# Patient Record
Sex: Female | Born: 1980
Health system: Southern US, Community
[De-identification: ages and names within clinical notes are randomized; demographics above are authoritative.]

## PROBLEM LIST (undated history)

## (undated) DIAGNOSIS — I509 Heart failure, unspecified: Secondary | ICD-10-CM

## (undated) HISTORY — PX: ABDOMINAL HYSTERECTOMY: SHX81

## (undated) HISTORY — PX: PACEMAKER PLACEMENT: SHX43

## (undated) HISTORY — PX: CARDIAC DEFIBRILLATOR PLACEMENT: SHX171

---

## 2008-05-29 ENCOUNTER — Ambulatory Visit: Payer: Self-pay | Admitting: Family Medicine

## 2011-03-06 ENCOUNTER — Ambulatory Visit: Payer: Self-pay

## 2013-07-28 DIAGNOSIS — I5022 Chronic systolic (congestive) heart failure: Secondary | ICD-10-CM | POA: Diagnosis not present

## 2013-09-16 DIAGNOSIS — I472 Ventricular tachycardia: Secondary | ICD-10-CM | POA: Diagnosis not present

## 2013-09-16 DIAGNOSIS — I4729 Other ventricular tachycardia: Secondary | ICD-10-CM | POA: Diagnosis not present

## 2013-09-16 DIAGNOSIS — Z5181 Encounter for therapeutic drug level monitoring: Secondary | ICD-10-CM | POA: Diagnosis not present

## 2013-09-16 DIAGNOSIS — I5022 Chronic systolic (congestive) heart failure: Secondary | ICD-10-CM | POA: Diagnosis not present

## 2013-09-16 DIAGNOSIS — Z79899 Other long term (current) drug therapy: Secondary | ICD-10-CM | POA: Diagnosis not present

## 2013-09-16 DIAGNOSIS — I428 Other cardiomyopathies: Secondary | ICD-10-CM | POA: Diagnosis not present

## 2013-09-16 DIAGNOSIS — Z95810 Presence of automatic (implantable) cardiac defibrillator: Secondary | ICD-10-CM | POA: Diagnosis not present

## 2013-09-16 DIAGNOSIS — R9431 Abnormal electrocardiogram [ECG] [EKG]: Secondary | ICD-10-CM | POA: Diagnosis not present

## 2013-09-16 DIAGNOSIS — I4949 Other premature depolarization: Secondary | ICD-10-CM | POA: Diagnosis not present

## 2013-09-16 DIAGNOSIS — Z4502 Encounter for adjustment and management of automatic implantable cardiac defibrillator: Secondary | ICD-10-CM | POA: Diagnosis not present

## 2013-12-31 DIAGNOSIS — M79671 Pain in right foot: Secondary | ICD-10-CM | POA: Diagnosis not present

## 2014-02-04 DIAGNOSIS — I5022 Chronic systolic (congestive) heart failure: Secondary | ICD-10-CM | POA: Diagnosis not present

## 2014-03-01 DIAGNOSIS — I472 Ventricular tachycardia: Secondary | ICD-10-CM | POA: Diagnosis not present

## 2014-04-20 DIAGNOSIS — I428 Other cardiomyopathies: Secondary | ICD-10-CM | POA: Diagnosis not present

## 2014-04-20 DIAGNOSIS — O903 Peripartum cardiomyopathy: Secondary | ICD-10-CM | POA: Diagnosis not present

## 2014-04-20 DIAGNOSIS — N951 Menopausal and female climacteric states: Secondary | ICD-10-CM | POA: Diagnosis not present

## 2014-04-20 DIAGNOSIS — R232 Flushing: Secondary | ICD-10-CM | POA: Diagnosis not present

## 2014-04-20 DIAGNOSIS — R5382 Chronic fatigue, unspecified: Secondary | ICD-10-CM | POA: Diagnosis not present

## 2014-04-20 DIAGNOSIS — R5383 Other fatigue: Secondary | ICD-10-CM | POA: Diagnosis not present

## 2014-04-20 DIAGNOSIS — Z01419 Encounter for gynecological examination (general) (routine) without abnormal findings: Secondary | ICD-10-CM | POA: Diagnosis not present

## 2014-08-31 DIAGNOSIS — I5022 Chronic systolic (congestive) heart failure: Secondary | ICD-10-CM | POA: Diagnosis not present

## 2014-12-19 DIAGNOSIS — F419 Anxiety disorder, unspecified: Secondary | ICD-10-CM | POA: Diagnosis not present

## 2015-01-05 DIAGNOSIS — F419 Anxiety disorder, unspecified: Secondary | ICD-10-CM | POA: Diagnosis not present

## 2015-01-15 ENCOUNTER — Ambulatory Visit
Admission: EM | Admit: 2015-01-15 | Discharge: 2015-01-15 | Disposition: A | Discharge status: Home / Self Care | Payer: Medicare Other | Attending: Family Medicine | Admitting: Family Medicine

## 2015-01-15 ENCOUNTER — Encounter: Payer: Self-pay | Admitting: *Deleted

## 2015-01-15 DIAGNOSIS — B349 Viral infection, unspecified: Secondary | ICD-10-CM | POA: Diagnosis not present

## 2015-01-15 DIAGNOSIS — R1110 Vomiting, unspecified: Secondary | ICD-10-CM | POA: Insufficient documentation

## 2015-01-15 DIAGNOSIS — R509 Fever, unspecified: Secondary | ICD-10-CM | POA: Diagnosis not present

## 2015-01-15 HISTORY — DX: Heart failure, unspecified: I50.9

## 2015-01-15 LAB — CBC WITH DIFFERENTIAL/PLATELET
Basophils Absolute: 0 10*3/uL (ref 0–0.1)
Basophils Relative: 0 %
Eosinophils Absolute: 0 10*3/uL (ref 0–0.7)
Eosinophils Relative: 1 %
HEMATOCRIT: 42.6 % (ref 35.0–47.0)
HEMOGLOBIN: 14.4 g/dL (ref 12.0–16.0)
LYMPHS PCT: 9 %
Lymphs Abs: 0.5 10*3/uL — ABNORMAL LOW (ref 1.0–3.6)
MCH: 30.8 pg (ref 26.0–34.0)
MCHC: 33.8 g/dL (ref 32.0–36.0)
MCV: 91.1 fL (ref 80.0–100.0)
MONO ABS: 0.5 10*3/uL (ref 0.2–0.9)
MONOS PCT: 8 %
NEUTROS ABS: 4.7 10*3/uL (ref 1.4–6.5)
NEUTROS PCT: 82 %
Platelets: 125 10*3/uL — ABNORMAL LOW (ref 150–440)
RBC: 4.68 MIL/uL (ref 3.80–5.20)
RDW: 13.3 % (ref 11.5–14.5)
WBC: 5.8 10*3/uL (ref 3.6–11.0)

## 2015-01-15 LAB — URINALYSIS COMPLETE WITH MICROSCOPIC (ARMC ONLY)
BACTERIA UA: NONE SEEN — AB
BILIRUBIN URINE: NEGATIVE
GLUCOSE, UA: NEGATIVE mg/dL
Hgb urine dipstick: NEGATIVE
Ketones, ur: NEGATIVE mg/dL
LEUKOCYTES UA: NEGATIVE
NITRITE: NEGATIVE
PH: 5.5 (ref 5.0–8.0)
Protein, ur: NEGATIVE mg/dL
RBC / HPF: NONE SEEN RBC/hpf (ref <3)
Specific Gravity, Urine: 1.01 (ref 1.005–1.030)

## 2015-01-15 LAB — RAPID INFLUENZA A&B ANTIGENS
Influenza A (ARMC): NOT DETECTED
Influenza B (ARMC): NOT DETECTED

## 2015-01-15 MED ORDER — IBUPROFEN 400 MG PO TABS
400.0000 mg | ORAL_TABLET | Freq: Once | ORAL | Status: AC
  Administered 2015-01-15: 400 mg via ORAL

## 2015-01-15 MED ORDER — ONDANSETRON 4 MG PO TBDP
4.0000 mg | ORAL_TABLET | Freq: Three times a day (TID) | ORAL | Status: DC | PRN
Start: 2015-01-15 — End: 2016-11-22

## 2015-01-15 NOTE — ED Provider Notes (Signed)
Patient presents today with symptoms of fever and vomiting. Patient also admits to having myalgias. She denies any joint pain or rash. She denies any upper respiratory symptoms. She has not had any diarrhea. She has a history of a hysterectomy. She does not have significant abdominal pain.  ROS: Negative except mentioned above. Vitals as per Epic.  GENERAL: NAD HEENT: no pharyngeal erythema, no exudate, no erythema of TMs, no cervical LAD RESP: CTA B CARD: RRR ABD: +BS, minimal suprapubic tenderness, no rebound or guarding, no flank tenderness  NEURO: CN II-XII grossly intact, negative meningeal signs  A/P: Viral Illness- Flu test negative, CBC shows white blood cell count of 5, urine analysis appears normal, would recommend supportive care at this time for patient will prescribe Zofran for nausea if needed, if symptoms do persist or worsen or change patient is to seek medical attention as discussed.  Paulina Fusi, MD 01/15/15 [phone]

## 2015-01-19 DIAGNOSIS — F419 Anxiety disorder, unspecified: Secondary | ICD-10-CM | POA: Diagnosis not present

## 2015-01-30 DIAGNOSIS — F419 Anxiety disorder, unspecified: Secondary | ICD-10-CM | POA: Diagnosis not present

## 2015-02-13 DIAGNOSIS — F419 Anxiety disorder, unspecified: Secondary | ICD-10-CM | POA: Diagnosis not present

## 2015-02-27 DIAGNOSIS — F419 Anxiety disorder, unspecified: Secondary | ICD-10-CM | POA: Diagnosis not present

## 2015-03-07 DIAGNOSIS — I472 Ventricular tachycardia: Secondary | ICD-10-CM | POA: Diagnosis not present

## 2015-03-07 DIAGNOSIS — Z4502 Encounter for adjustment and management of automatic implantable cardiac defibrillator: Secondary | ICD-10-CM | POA: Diagnosis not present

## 2015-03-07 DIAGNOSIS — Z95810 Presence of automatic (implantable) cardiac defibrillator: Secondary | ICD-10-CM | POA: Diagnosis not present

## 2015-03-13 DIAGNOSIS — F419 Anxiety disorder, unspecified: Secondary | ICD-10-CM | POA: Diagnosis not present

## 2015-03-15 DIAGNOSIS — Z23 Encounter for immunization: Secondary | ICD-10-CM | POA: Diagnosis not present

## 2015-03-15 DIAGNOSIS — Z79899 Other long term (current) drug therapy: Secondary | ICD-10-CM | POA: Diagnosis not present

## 2015-03-15 DIAGNOSIS — I5022 Chronic systolic (congestive) heart failure: Secondary | ICD-10-CM | POA: Diagnosis not present

## 2015-03-15 DIAGNOSIS — I472 Ventricular tachycardia: Secondary | ICD-10-CM | POA: Diagnosis not present

## 2015-04-17 DIAGNOSIS — F419 Anxiety disorder, unspecified: Secondary | ICD-10-CM | POA: Diagnosis not present

## 2015-05-01 DIAGNOSIS — F419 Anxiety disorder, unspecified: Secondary | ICD-10-CM | POA: Diagnosis not present

## 2015-05-15 DIAGNOSIS — F419 Anxiety disorder, unspecified: Secondary | ICD-10-CM | POA: Diagnosis not present

## 2015-06-08 DIAGNOSIS — Z95810 Presence of automatic (implantable) cardiac defibrillator: Secondary | ICD-10-CM | POA: Diagnosis not present

## 2015-06-08 DIAGNOSIS — O903 Peripartum cardiomyopathy: Secondary | ICD-10-CM | POA: Diagnosis not present

## 2015-06-26 DIAGNOSIS — F419 Anxiety disorder, unspecified: Secondary | ICD-10-CM | POA: Diagnosis not present

## 2015-07-10 DIAGNOSIS — F419 Anxiety disorder, unspecified: Secondary | ICD-10-CM | POA: Diagnosis not present

## 2015-07-24 DIAGNOSIS — F419 Anxiety disorder, unspecified: Secondary | ICD-10-CM | POA: Diagnosis not present

## 2015-08-23 DIAGNOSIS — F419 Anxiety disorder, unspecified: Secondary | ICD-10-CM | POA: Diagnosis not present

## 2015-09-04 DIAGNOSIS — F419 Anxiety disorder, unspecified: Secondary | ICD-10-CM | POA: Diagnosis not present

## 2015-09-20 DIAGNOSIS — I5022 Chronic systolic (congestive) heart failure: Secondary | ICD-10-CM | POA: Diagnosis not present

## 2015-09-20 DIAGNOSIS — Z8679 Personal history of other diseases of the circulatory system: Secondary | ICD-10-CM | POA: Diagnosis not present

## 2015-09-20 DIAGNOSIS — I493 Ventricular premature depolarization: Secondary | ICD-10-CM | POA: Diagnosis not present

## 2015-09-20 DIAGNOSIS — I4581 Long QT syndrome: Secondary | ICD-10-CM | POA: Diagnosis not present

## 2015-09-20 DIAGNOSIS — F419 Anxiety disorder, unspecified: Secondary | ICD-10-CM | POA: Diagnosis not present

## 2015-10-02 DIAGNOSIS — F419 Anxiety disorder, unspecified: Secondary | ICD-10-CM | POA: Diagnosis not present

## 2015-10-05 DIAGNOSIS — Z8679 Personal history of other diseases of the circulatory system: Secondary | ICD-10-CM | POA: Diagnosis not present

## 2015-10-05 DIAGNOSIS — I5022 Chronic systolic (congestive) heart failure: Secondary | ICD-10-CM | POA: Diagnosis not present

## 2015-10-05 DIAGNOSIS — Z4502 Encounter for adjustment and management of automatic implantable cardiac defibrillator: Secondary | ICD-10-CM | POA: Diagnosis not present

## 2015-10-05 DIAGNOSIS — Z95810 Presence of automatic (implantable) cardiac defibrillator: Secondary | ICD-10-CM | POA: Diagnosis not present

## 2015-10-05 DIAGNOSIS — R000 Tachycardia, unspecified: Secondary | ICD-10-CM | POA: Diagnosis not present

## 2015-10-10 DIAGNOSIS — I493 Ventricular premature depolarization: Secondary | ICD-10-CM | POA: Diagnosis not present

## 2015-10-16 DIAGNOSIS — F419 Anxiety disorder, unspecified: Secondary | ICD-10-CM | POA: Diagnosis not present

## 2015-10-23 DIAGNOSIS — R931 Abnormal findings on diagnostic imaging of heart and coronary circulation: Secondary | ICD-10-CM | POA: Diagnosis not present

## 2015-10-23 DIAGNOSIS — I5022 Chronic systolic (congestive) heart failure: Secondary | ICD-10-CM | POA: Diagnosis not present

## 2015-10-23 DIAGNOSIS — I517 Cardiomegaly: Secondary | ICD-10-CM | POA: Diagnosis not present

## 2015-10-30 DIAGNOSIS — F419 Anxiety disorder, unspecified: Secondary | ICD-10-CM | POA: Diagnosis not present

## 2015-11-14 DIAGNOSIS — F419 Anxiety disorder, unspecified: Secondary | ICD-10-CM | POA: Diagnosis not present

## 2015-11-20 DIAGNOSIS — F419 Anxiety disorder, unspecified: Secondary | ICD-10-CM | POA: Diagnosis not present

## 2016-01-18 DIAGNOSIS — Z23 Encounter for immunization: Secondary | ICD-10-CM | POA: Diagnosis not present

## 2016-02-16 ENCOUNTER — Encounter: Payer: Self-pay | Admitting: Emergency Medicine

## 2016-02-16 ENCOUNTER — Ambulatory Visit
Admission: EM | Admit: 2016-02-16 | Discharge: 2016-02-16 | Disposition: A | Discharge status: Home / Self Care | Payer: Medicare Other | Attending: Family Medicine | Admitting: Family Medicine

## 2016-02-16 DIAGNOSIS — J069 Acute upper respiratory infection, unspecified: Secondary | ICD-10-CM | POA: Diagnosis not present

## 2016-02-16 DIAGNOSIS — Z0001 Encounter for general adult medical examination with abnormal findings: Secondary | ICD-10-CM | POA: Diagnosis not present

## 2016-02-16 DIAGNOSIS — R05 Cough: Secondary | ICD-10-CM | POA: Diagnosis not present

## 2016-02-16 DIAGNOSIS — H9209 Otalgia, unspecified ear: Secondary | ICD-10-CM | POA: Diagnosis not present

## 2016-02-16 DIAGNOSIS — H6981 Other specified disorders of Eustachian tube, right ear: Secondary | ICD-10-CM

## 2016-02-16 DIAGNOSIS — J029 Acute pharyngitis, unspecified: Secondary | ICD-10-CM | POA: Insufficient documentation

## 2016-02-16 LAB — RAPID STREP SCREEN (MED CTR MEBANE ONLY): Streptococcus, Group A Screen (Direct): NEGATIVE

## 2016-02-16 MED ORDER — AMOXICILLIN 500 MG PO CAPS: 500.0000 mg | ORAL_CAPSULE | Freq: Three times a day (TID) | ORAL | 0 refills | Status: DC

## 2016-02-16 NOTE — ED Provider Notes (Signed)
[facility]    CSN: [id number] Arrival date & time: 02/16/16  1126     History   Chief Complaint Chief Complaint  Patient presents with  . Sore Throat  . Otalgia  . Cough    HPI Kim Morrow is a 35 y.o. female.   The history is provided by the patient.  URI  Presenting symptoms: congestion, ear pain, facial pain, rhinorrhea and sore throat   Presenting symptoms: no cough, no fatigue and no fever   Congestion:    Location:  Nasal   Interferes with sleep: yes   Ear pain:    Location:  Right   Severity:  Moderate   Onset quality:  Gradual   Timing:  Intermittent   Progression:  Waxing and waning   Chronicity:  New Rhinorrhea:    Quality:  Yellow   Severity:  Mild   Timing:  Intermittent Severity:  Mild Onset quality:  Gradual Timing:  Intermittent Progression:  Waxing and waning Relieved by:  Nothing Associated symptoms: sinus pain   Associated symptoms: no arthralgias, no headaches, no myalgias, no neck pain, no sneezing, no swollen glands and no wheezing     Past Medical History:  Diagnosis Date  . CHF (congestive heart failure) ([place])     There are no active problems to display for this patient.   Past Surgical History:  Procedure Laterality Date  . ABDOMINAL HYSTERECTOMY    . CARDIAC DEFIBRILLATOR PLACEMENT    . PACEMAKER PLACEMENT      OB History    No data available       Home Medications    Prior to Admission medications   Medication Sig Start Date End Date Taking? Authorizing Provider  amoxicillin (AMOXIL) 500 MG capsule Take 1 capsule (500 mg total) by mouth 3 (three) times daily. 02/16/16   Juline Patch, MD  losartan (COZAAR) 25 MG tablet Take 25 mg by mouth daily.    Historical Provider, MD  ondansetron (ZOFRAN ODT) 4 MG disintegrating tablet Take 1 tablet (4 mg total) by mouth every 8 (eight) hours as needed for nausea or vomiting. 01/15/15   Paulina Fusi, MD    Family History History reviewed. No pertinent  family history.  Social History Social History  Substance Use Topics  . Smoking status: Never Smoker  . Smokeless tobacco: Never Used  . Alcohol use No     Allergies   Dilaudid [hydromorphone]; Morphine and related; and Tylenol [acetaminophen]   Review of Systems Review of Systems  Constitutional: Negative for chills, diaphoresis, fatigue and fever.  HENT: Positive for congestion, ear pain, postnasal drip, rhinorrhea, sinus pain, sinus pressure and sore throat. Negative for drooling, ear discharge, facial swelling, mouth sores, nosebleeds, sneezing, tinnitus and trouble swallowing.   Eyes: Negative for discharge and itching.  Respiratory: Negative for cough, shortness of breath and wheezing.   Gastrointestinal: Negative for abdominal distention, blood in stool and nausea.  Musculoskeletal: Negative for arthralgias, back pain, myalgias and neck pain.  Neurological: Negative for headaches.     Physical Exam Triage Vital Signs ED Triage Vitals  Enc Vitals Group     BP 02/16/16 1302 105/64     Pulse Rate 02/16/16 1302 74     Resp 02/16/16 1302 16     Temp 02/16/16 1302 97.9 F (36.6 C)     Temp Source 02/16/16 1302 Tympanic     SpO2 02/16/16 1302 100 %     Weight 02/16/16 1301 143 lb (64.9 kg)  Height 02/16/16 1301 5\' 11"  (1.803 m)     Head Circumference --      Peak Flow --      Pain Score 02/16/16 1302 4     Pain Loc --      Pain Edu? --      Excl. in [place]? --    No data found.   Updated Vital Signs BP 105/64 (BP Location: Left Arm)   Pulse 74   Temp 97.9 F (36.6 C) (Tympanic)   Resp 16   Ht 5\' 11"  (1.803 m)   Wt 143 lb (64.9 kg)   SpO2 100%   BMI 19.94 kg/m   Visual Acuity Right Eye Distance:   Left Eye Distance:   Bilateral Distance:    Right Eye Near:   Left Eye Near:    Bilateral Near:     Physical Exam  Constitutional: No distress.  HENT:  Head: Normocephalic and atraumatic.  Right Ear: Hearing, external ear and ear canal normal.  Tympanic membrane is retracted.  Left Ear: Hearing, external ear and ear canal normal. Tympanic membrane is retracted.  Nose: Right sinus exhibits maxillary sinus tenderness. Left sinus exhibits maxillary sinus tenderness.  Mouth/Throat: Oropharynx is clear and moist.  Eyes: Conjunctivae and EOM are normal. Pupils are equal, round, and reactive to light. Right eye exhibits no discharge. Left eye exhibits no discharge.  Neck: Normal range of motion. Neck supple. No JVD present. No thyromegaly present.  Cardiovascular: Normal rate, regular rhythm, normal heart sounds and intact distal pulses.  Exam reveals no gallop and no friction rub.   No murmur heard. Pulmonary/Chest: Effort normal and breath sounds normal.  Abdominal: Soft. Bowel sounds are normal. She exhibits no mass. There is no tenderness. There is no guarding.  Musculoskeletal: Normal range of motion. She exhibits no edema.  Lymphadenopathy:    She has no cervical adenopathy.  Neurological: She is alert. She has normal reflexes.  Skin: Skin is warm and dry. She is not diaphoretic.     UC Treatments / Results  Labs (all labs ordered are listed, but only abnormal results are displayed) Labs Reviewed  RAPID STREP SCREEN (NOT AT [facility])  CULTURE, GROUP A STREP [facility])    EKG  EKG Interpretation None       Radiology No results found.  Procedures Procedures (including critical care time)  Medications Ordered in UC Medications - No data to display   Initial Impression / Assessment and Plan / UC Course  I have reviewed the triage vital signs and the nursing notes.  Pertinent labs & imaging results that were available during my care of the patient were reviewed by me and considered in my medical decision making (see chart for details).  Clinical Course       Final Clinical Impressions(s) / UC Diagnoses   Final diagnoses:  Upper respiratory tract infection, unspecified type  Pharyngitis, unspecified etiology    Dysfunction of right eustachian tube    New Prescriptions New Prescriptions   AMOXICILLIN (AMOXIL) 500 MG CAPSULE    Take 1 capsule (500 mg total) by mouth 3 (three) times daily.     Juline Patch, MD 02/16/16 1350

## 2016-02-16 NOTE — ED Triage Notes (Signed)
Patient c/o sore throat, cough, and right ear pain that started on Wed.

## 2016-02-19 ENCOUNTER — Telehealth: Payer: Self-pay | Admitting: *Deleted

## 2016-02-19 LAB — CULTURE, GROUP A STREP (THRC)

## 2016-02-19 NOTE — Telephone Encounter (Signed)
Called patient, no answer, left message on voicemail reporting a negative strep culture result. Advised patient to follow up with PCP or [facility] if symptoms persist.

## 2016-04-26 DIAGNOSIS — Z4502 Encounter for adjustment and management of automatic implantable cardiac defibrillator: Secondary | ICD-10-CM | POA: Diagnosis not present

## 2016-04-26 DIAGNOSIS — Z95810 Presence of automatic (implantable) cardiac defibrillator: Secondary | ICD-10-CM | POA: Diagnosis not present

## 2016-08-02 DIAGNOSIS — Z95810 Presence of automatic (implantable) cardiac defibrillator: Secondary | ICD-10-CM | POA: Diagnosis not present

## 2016-08-02 DIAGNOSIS — Z4502 Encounter for adjustment and management of automatic implantable cardiac defibrillator: Secondary | ICD-10-CM | POA: Diagnosis not present

## 2016-10-04 DIAGNOSIS — Z808 Family history of malignant neoplasm of other organs or systems: Secondary | ICD-10-CM | POA: Diagnosis not present

## 2016-10-04 DIAGNOSIS — B078 Other viral warts: Secondary | ICD-10-CM | POA: Diagnosis not present

## 2016-10-04 DIAGNOSIS — L308 Other specified dermatitis: Secondary | ICD-10-CM | POA: Diagnosis not present

## 2016-10-07 DIAGNOSIS — I5022 Chronic systolic (congestive) heart failure: Secondary | ICD-10-CM | POA: Diagnosis not present

## 2016-10-09 DIAGNOSIS — M546 Pain in thoracic spine: Secondary | ICD-10-CM | POA: Diagnosis not present

## 2016-10-09 DIAGNOSIS — M9902 Segmental and somatic dysfunction of thoracic region: Secondary | ICD-10-CM | POA: Diagnosis not present

## 2016-10-09 DIAGNOSIS — M9903 Segmental and somatic dysfunction of lumbar region: Secondary | ICD-10-CM | POA: Diagnosis not present

## 2016-10-09 DIAGNOSIS — M5136 Other intervertebral disc degeneration, lumbar region: Secondary | ICD-10-CM | POA: Diagnosis not present

## 2016-10-11 DIAGNOSIS — M5136 Other intervertebral disc degeneration, lumbar region: Secondary | ICD-10-CM | POA: Diagnosis not present

## 2016-10-11 DIAGNOSIS — M9903 Segmental and somatic dysfunction of lumbar region: Secondary | ICD-10-CM | POA: Diagnosis not present

## 2016-10-11 DIAGNOSIS — M9902 Segmental and somatic dysfunction of thoracic region: Secondary | ICD-10-CM | POA: Diagnosis not present

## 2016-10-11 DIAGNOSIS — M546 Pain in thoracic spine: Secondary | ICD-10-CM | POA: Diagnosis not present

## 2016-10-16 DIAGNOSIS — Z1151 Encounter for screening for human papillomavirus (HPV): Secondary | ICD-10-CM | POA: Diagnosis not present

## 2016-10-16 DIAGNOSIS — M9902 Segmental and somatic dysfunction of thoracic region: Secondary | ICD-10-CM | POA: Diagnosis not present

## 2016-10-16 DIAGNOSIS — M546 Pain in thoracic spine: Secondary | ICD-10-CM | POA: Diagnosis not present

## 2016-10-16 DIAGNOSIS — Z8639 Personal history of other endocrine, nutritional and metabolic disease: Secondary | ICD-10-CM | POA: Diagnosis not present

## 2016-10-16 DIAGNOSIS — M5136 Other intervertebral disc degeneration, lumbar region: Secondary | ICD-10-CM | POA: Diagnosis not present

## 2016-10-16 DIAGNOSIS — Z01419 Encounter for gynecological examination (general) (routine) without abnormal findings: Secondary | ICD-10-CM | POA: Diagnosis not present

## 2016-10-16 DIAGNOSIS — M9903 Segmental and somatic dysfunction of lumbar region: Secondary | ICD-10-CM | POA: Diagnosis not present

## 2016-10-16 DIAGNOSIS — Z803 Family history of malignant neoplasm of breast: Secondary | ICD-10-CM | POA: Diagnosis not present

## 2016-10-18 ENCOUNTER — Other Ambulatory Visit: Payer: Self-pay | Admitting: Obstetrics and Gynecology

## 2016-10-18 DIAGNOSIS — M5136 Other intervertebral disc degeneration, lumbar region: Secondary | ICD-10-CM | POA: Diagnosis not present

## 2016-10-18 DIAGNOSIS — M9903 Segmental and somatic dysfunction of lumbar region: Secondary | ICD-10-CM | POA: Diagnosis not present

## 2016-10-18 DIAGNOSIS — M546 Pain in thoracic spine: Secondary | ICD-10-CM | POA: Diagnosis not present

## 2016-10-18 DIAGNOSIS — Z1231 Encounter for screening mammogram for malignant neoplasm of breast: Secondary | ICD-10-CM

## 2016-10-18 DIAGNOSIS — M9902 Segmental and somatic dysfunction of thoracic region: Secondary | ICD-10-CM | POA: Diagnosis not present

## 2016-10-21 DIAGNOSIS — M9902 Segmental and somatic dysfunction of thoracic region: Secondary | ICD-10-CM | POA: Diagnosis not present

## 2016-10-21 DIAGNOSIS — M9903 Segmental and somatic dysfunction of lumbar region: Secondary | ICD-10-CM | POA: Diagnosis not present

## 2016-10-21 DIAGNOSIS — M5136 Other intervertebral disc degeneration, lumbar region: Secondary | ICD-10-CM | POA: Diagnosis not present

## 2016-10-21 DIAGNOSIS — M546 Pain in thoracic spine: Secondary | ICD-10-CM | POA: Diagnosis not present

## 2016-10-23 DIAGNOSIS — M9902 Segmental and somatic dysfunction of thoracic region: Secondary | ICD-10-CM | POA: Diagnosis not present

## 2016-10-23 DIAGNOSIS — M5136 Other intervertebral disc degeneration, lumbar region: Secondary | ICD-10-CM | POA: Diagnosis not present

## 2016-10-23 DIAGNOSIS — M9903 Segmental and somatic dysfunction of lumbar region: Secondary | ICD-10-CM | POA: Diagnosis not present

## 2016-10-23 DIAGNOSIS — M546 Pain in thoracic spine: Secondary | ICD-10-CM | POA: Diagnosis not present

## 2016-10-24 DIAGNOSIS — Z95810 Presence of automatic (implantable) cardiac defibrillator: Secondary | ICD-10-CM | POA: Diagnosis not present

## 2016-10-24 DIAGNOSIS — Z4502 Encounter for adjustment and management of automatic implantable cardiac defibrillator: Secondary | ICD-10-CM | POA: Diagnosis not present

## 2016-10-24 DIAGNOSIS — I472 Ventricular tachycardia: Secondary | ICD-10-CM | POA: Diagnosis not present

## 2016-10-24 DIAGNOSIS — I5022 Chronic systolic (congestive) heart failure: Secondary | ICD-10-CM | POA: Diagnosis not present

## 2016-10-25 DIAGNOSIS — M5136 Other intervertebral disc degeneration, lumbar region: Secondary | ICD-10-CM | POA: Diagnosis not present

## 2016-10-25 DIAGNOSIS — M9903 Segmental and somatic dysfunction of lumbar region: Secondary | ICD-10-CM | POA: Diagnosis not present

## 2016-10-25 DIAGNOSIS — M9902 Segmental and somatic dysfunction of thoracic region: Secondary | ICD-10-CM | POA: Diagnosis not present

## 2016-10-25 DIAGNOSIS — M546 Pain in thoracic spine: Secondary | ICD-10-CM | POA: Diagnosis not present

## 2016-10-28 DIAGNOSIS — M9902 Segmental and somatic dysfunction of thoracic region: Secondary | ICD-10-CM | POA: Diagnosis not present

## 2016-10-28 DIAGNOSIS — M9903 Segmental and somatic dysfunction of lumbar region: Secondary | ICD-10-CM | POA: Diagnosis not present

## 2016-10-28 DIAGNOSIS — M5136 Other intervertebral disc degeneration, lumbar region: Secondary | ICD-10-CM | POA: Diagnosis not present

## 2016-10-28 DIAGNOSIS — M546 Pain in thoracic spine: Secondary | ICD-10-CM | POA: Diagnosis not present

## 2016-10-30 DIAGNOSIS — M9902 Segmental and somatic dysfunction of thoracic region: Secondary | ICD-10-CM | POA: Diagnosis not present

## 2016-10-30 DIAGNOSIS — M546 Pain in thoracic spine: Secondary | ICD-10-CM | POA: Diagnosis not present

## 2016-10-30 DIAGNOSIS — M9903 Segmental and somatic dysfunction of lumbar region: Secondary | ICD-10-CM | POA: Diagnosis not present

## 2016-10-30 DIAGNOSIS — M5136 Other intervertebral disc degeneration, lumbar region: Secondary | ICD-10-CM | POA: Diagnosis not present

## 2016-11-01 DIAGNOSIS — M5136 Other intervertebral disc degeneration, lumbar region: Secondary | ICD-10-CM | POA: Diagnosis not present

## 2016-11-01 DIAGNOSIS — M9903 Segmental and somatic dysfunction of lumbar region: Secondary | ICD-10-CM | POA: Diagnosis not present

## 2016-11-01 DIAGNOSIS — M9902 Segmental and somatic dysfunction of thoracic region: Secondary | ICD-10-CM | POA: Diagnosis not present

## 2016-11-01 DIAGNOSIS — M546 Pain in thoracic spine: Secondary | ICD-10-CM | POA: Diagnosis not present

## 2016-11-04 DIAGNOSIS — M5136 Other intervertebral disc degeneration, lumbar region: Secondary | ICD-10-CM | POA: Diagnosis not present

## 2016-11-04 DIAGNOSIS — M9902 Segmental and somatic dysfunction of thoracic region: Secondary | ICD-10-CM | POA: Diagnosis not present

## 2016-11-04 DIAGNOSIS — M546 Pain in thoracic spine: Secondary | ICD-10-CM | POA: Diagnosis not present

## 2016-11-04 DIAGNOSIS — M9903 Segmental and somatic dysfunction of lumbar region: Secondary | ICD-10-CM | POA: Diagnosis not present

## 2016-11-06 DIAGNOSIS — M9903 Segmental and somatic dysfunction of lumbar region: Secondary | ICD-10-CM | POA: Diagnosis not present

## 2016-11-06 DIAGNOSIS — M546 Pain in thoracic spine: Secondary | ICD-10-CM | POA: Diagnosis not present

## 2016-11-06 DIAGNOSIS — M5136 Other intervertebral disc degeneration, lumbar region: Secondary | ICD-10-CM | POA: Diagnosis not present

## 2016-11-06 DIAGNOSIS — M9902 Segmental and somatic dysfunction of thoracic region: Secondary | ICD-10-CM | POA: Diagnosis not present

## 2016-11-08 DIAGNOSIS — M546 Pain in thoracic spine: Secondary | ICD-10-CM | POA: Diagnosis not present

## 2016-11-08 DIAGNOSIS — M9902 Segmental and somatic dysfunction of thoracic region: Secondary | ICD-10-CM | POA: Diagnosis not present

## 2016-11-08 DIAGNOSIS — M9903 Segmental and somatic dysfunction of lumbar region: Secondary | ICD-10-CM | POA: Diagnosis not present

## 2016-11-08 DIAGNOSIS — M5136 Other intervertebral disc degeneration, lumbar region: Secondary | ICD-10-CM | POA: Diagnosis not present

## 2016-11-15 DIAGNOSIS — M9902 Segmental and somatic dysfunction of thoracic region: Secondary | ICD-10-CM | POA: Diagnosis not present

## 2016-11-15 DIAGNOSIS — M5136 Other intervertebral disc degeneration, lumbar region: Secondary | ICD-10-CM | POA: Diagnosis not present

## 2016-11-15 DIAGNOSIS — M9903 Segmental and somatic dysfunction of lumbar region: Secondary | ICD-10-CM | POA: Diagnosis not present

## 2016-11-15 DIAGNOSIS — M546 Pain in thoracic spine: Secondary | ICD-10-CM | POA: Diagnosis not present

## 2016-11-18 ENCOUNTER — Ambulatory Visit
Admission: RE | Admit: 2016-11-18 | Discharge: 2016-11-18 | Disposition: A | Discharge status: Home / Self Care | Payer: Medicare Other | Source: Ambulatory Visit | Attending: Obstetrics and Gynecology | Admitting: Obstetrics and Gynecology

## 2016-11-18 ENCOUNTER — Ambulatory Visit: Payer: Medicare Other

## 2016-11-18 DIAGNOSIS — Z1231 Encounter for screening mammogram for malignant neoplasm of breast: Secondary | ICD-10-CM | POA: Diagnosis not present

## 2016-11-19 DIAGNOSIS — Z1379 Encounter for other screening for genetic and chromosomal anomalies: Secondary | ICD-10-CM | POA: Insufficient documentation

## 2016-11-19 NOTE — Progress Notes (Signed)
[place]  [phone] [phone]  ID: Kim Morrow OB: Jul 23, 1980  MR#: [id number]  OZD#:[id number]  Patient Care Team: Patient, No Pcp Per as PCP - General (General Practice)  CHIEF COMPLAINT: Genetic counseling and testing.  INTERVAL HISTORY: Patient is a 36 year old female with no personal history of malignancy, but does report an extensive family history. She is status post hysterectomy, but has her ovaries intact. She has significant cardiac disease and has had a defibrillator/pacemaker since age of 28. She currently feels well and is asymptomatic. She has no neurologic complaints. She denies any recent fevers or illnesses. She has a good appetite and denies weight loss. She has no chest pain or shortness of breath. She denies any nausea, vomiting, constipation, or diarrhea. She has no abdominal bloating. She denies any melena or hematochezia. She has no urinary complaints. Patient feels at her baseline and offers no specific complaints today.  REVIEW OF SYSTEMS:   Review of Systems  Constitutional: Negative.  Negative for fever, malaise/fatigue and weight loss.  HENT: Negative.   Respiratory: Negative.  Negative for cough and shortness of breath.   Cardiovascular: Negative.  Negative for chest pain and leg swelling.  Gastrointestinal: Negative.  Negative for abdominal pain, blood in stool and melena.  Genitourinary: Negative.  Negative for flank pain and hematuria.  Musculoskeletal: Negative.   Skin: Negative.  Negative for rash.  Neurological: Negative.  Negative for weakness.  Psychiatric/Behavioral: Negative.  The patient is not nervous/anxious.     As per HPI. Otherwise, a complete review of systems is negative.  PAST MEDICAL HISTORY: Past Medical History:  Diagnosis Date  . CHF (congestive heart failure) ([place])     PAST SURGICAL HISTORY: Past Surgical History:  Procedure Laterality Date  . ABDOMINAL HYSTERECTOMY    .  CARDIAC DEFIBRILLATOR PLACEMENT    . PACEMAKER PLACEMENT      FAMILY HISTORY: Family History  Problem Relation Age of Onset  . Breast cancer Paternal Grandmother 68       twice  . Breast cancer Other 32       Great grandmother paternal side  . Cancer Father        Melanoma  . Cancer Maternal Grandmother 42       Ovarian Cancer; Kidney Cancer; Peritoneal Carcinomatosis; Melanoma  . Cancer Paternal Grandfather        Colon    ADVANCED DIRECTIVES (Y/N):  N  HEALTH MAINTENANCE: Social History  Substance Use Topics  . Smoking status: Never Smoker  . Smokeless tobacco: Never Used  . Alcohol use No     Colonoscopy:  PAP:  Bone density:  Lipid panel:  Allergies  Allergen Reactions  . Dilaudid [Hydromorphone] Anxiety  . Morphine And Related Anxiety  . Tylenol [Acetaminophen] Anxiety    Current Outpatient Prescriptions  Medication Sig Dispense Refill  . cholecalciferol (VITAMIN D) 1000 units tablet Take 1,000 Units by mouth daily.    Marland Kitchen losartan (COZAAR) 25 MG tablet Take 25 mg by mouth daily.    . sotalol (BETAPACE) 120 MG tablet Take 40 mg by mouth 2 (two) times daily.    Marland Kitchen spironolactone (ALDACTONE) 25 MG tablet Take 25 mg by mouth daily.    Marland Kitchen torsemide (DEMADEX) 10 MG tablet Take 40 mg by mouth daily. As needed     No current facility-administered medications for this visit.     OBJECTIVE: Vitals:   11/22/16 1131  BP: 99/63  Pulse: 69  Resp: 20  Temp: Marland Kitchen)  97.3 F (36.3 C)     Body mass index is 20.94 kg/m.    ECOG FS:0 - Asymptomatic  General: Well-developed, well-nourished, no acute distress. Eyes: Pink conjunctiva, anicteric sclera. HEENT: Normocephalic, moist mucous membranes, clear oropharnyx. Musculoskeletal: No edema, cyanosis, or clubbing. Neuro: Alert, answering all questions appropriately. Cranial nerves grossly intact. Skin: No rashes or petechiae noted. Psych: Normal affect. Lymphatics: No cervical, calvicular, axillary or inguinal  LAD.   LAB RESULTS:  No results found for: NA, K, CL, CO2, GLUCOSE, BUN, CREATININE, CALCIUM, PROT, ALBUMIN, AST, ALT, ALKPHOS, BILITOT, GFRNONAA, GFRAA  Lab Results  Component Value Date   WBC 5.8 01/15/2015   NEUTROABS 4.7 01/15/2015   HGB 14.4 01/15/2015   HCT 42.6 01/15/2015   MCV 91.1 01/15/2015   PLT 125 (L) 01/15/2015     STUDIES: Mm Digital Screening Bilateral  Result Date: 11/18/2016 CLINICAL DATA:  Screening. EXAM: DIGITAL SCREENING BILATERAL MAMMOGRAM WITH CAD COMPARISON:  None. ACR Breast Density Category d: The breast tissue is extremely dense, which lowers the sensitivity of mammography. FINDINGS: There are no findings suspicious for malignancy. Images were processed with CAD. IMPRESSION: No mammographic evidence of malignancy. A result letter of this screening mammogram will be mailed directly to the patient. RECOMMENDATION: Screening mammogram at age 2. (Code:SM-B-40A) BI-RADS CATEGORY  1: Negative. Electronically Signed   By: Lajean Manes M.D.   On: 11/18/2016 11:00    ASSESSMENT: Genetic counseling and testing.  PLAN:    1. Genetic counseling and testing: Patient has no personal history of malignancy. On her paternal side, her father had melanoma in his 85s. Her paternal grandfather had colon cancer at unknown age and paternal grandmother had breast cancer at the age of 9. Paternal great-grandmother had breast cancer at the age of 3 and a recurrence at the age of 25. On her maternal side, her mother has no reported history of malignancy but patient states she has had no contact with her in over a decade so she is unsure of any recent medical history. She had a maternal grandmother with ovarian cancer at the age of 70 and a recurrence in her 56s. Maternal grandmother also had melanoma and kidney cancer. Given her extensive family history, have recommended a full panel to assess for underlying genetic abnormalities. If positive, patient will return to clinic in several  weeks to discuss the results and additional prophylactic measures she can take. If negative, patient has been instructed to continue yearly mammograms as prescribed. Continue internal exams and vaginal cuff assessment as directed by her gynecologist. She has also been instructed to ensure she has a colonoscopy between the ages of 76 and 27. Given her young age, removal of her ovaries is not recommended if she has no genetic predisposition, but given her family history she is at increased risk and concurrent consider this in the future. No follow-up has been made at this time.  Approximately 60 minutes was spent in discussion of which greater than 50% was consultation.  Patient expressed understanding and was in agreement with this plan. She also understands that She can call clinic at any time with any questions, concerns, or complaints.    Lloyd Huger, MD   11/22/2016 12:20 PM

## 2016-11-20 DIAGNOSIS — M9903 Segmental and somatic dysfunction of lumbar region: Secondary | ICD-10-CM | POA: Diagnosis not present

## 2016-11-20 DIAGNOSIS — M9902 Segmental and somatic dysfunction of thoracic region: Secondary | ICD-10-CM | POA: Diagnosis not present

## 2016-11-20 DIAGNOSIS — M546 Pain in thoracic spine: Secondary | ICD-10-CM | POA: Diagnosis not present

## 2016-11-20 DIAGNOSIS — M5136 Other intervertebral disc degeneration, lumbar region: Secondary | ICD-10-CM | POA: Diagnosis not present

## 2016-11-22 ENCOUNTER — Inpatient Hospital Stay: Payer: Medicare Other | Attending: Oncology | Admitting: Oncology

## 2016-11-22 ENCOUNTER — Encounter: Payer: Self-pay | Admitting: Oncology

## 2016-11-22 DIAGNOSIS — Z95810 Presence of automatic (implantable) cardiac defibrillator: Secondary | ICD-10-CM

## 2016-11-22 DIAGNOSIS — Z803 Family history of malignant neoplasm of breast: Secondary | ICD-10-CM | POA: Diagnosis not present

## 2016-11-22 DIAGNOSIS — Z1379 Encounter for other screening for genetic and chromosomal anomalies: Secondary | ICD-10-CM | POA: Insufficient documentation

## 2016-11-22 DIAGNOSIS — Z85820 Personal history of malignant melanoma of skin: Secondary | ICD-10-CM | POA: Diagnosis not present

## 2016-11-22 DIAGNOSIS — Z950 Presence of cardiac pacemaker: Secondary | ICD-10-CM | POA: Insufficient documentation

## 2016-11-22 DIAGNOSIS — I509 Heart failure, unspecified: Secondary | ICD-10-CM | POA: Diagnosis not present

## 2016-11-22 DIAGNOSIS — Z79899 Other long term (current) drug therapy: Secondary | ICD-10-CM | POA: Insufficient documentation

## 2016-11-22 DIAGNOSIS — Z808 Family history of malignant neoplasm of other organs or systems: Secondary | ICD-10-CM | POA: Diagnosis not present

## 2016-11-22 DIAGNOSIS — Z800 Family history of malignant neoplasm of digestive organs: Secondary | ICD-10-CM

## 2016-11-22 DIAGNOSIS — Z8041 Family history of malignant neoplasm of ovary: Secondary | ICD-10-CM | POA: Diagnosis not present

## 2016-11-22 DIAGNOSIS — Z8051 Family history of malignant neoplasm of kidney: Secondary | ICD-10-CM | POA: Insufficient documentation

## 2016-11-22 NOTE — Progress Notes (Signed)
Patient here today for initial visit regarding genetic counseling.

## 2017-01-25 DIAGNOSIS — Z23 Encounter for immunization: Secondary | ICD-10-CM | POA: Diagnosis not present

## 2017-02-27 DIAGNOSIS — Z4502 Encounter for adjustment and management of automatic implantable cardiac defibrillator: Secondary | ICD-10-CM | POA: Diagnosis not present

## 2017-02-27 DIAGNOSIS — Z95810 Presence of automatic (implantable) cardiac defibrillator: Secondary | ICD-10-CM | POA: Diagnosis not present

## 2017-02-27 DIAGNOSIS — I5022 Chronic systolic (congestive) heart failure: Secondary | ICD-10-CM | POA: Diagnosis not present

## 2017-03-10 DIAGNOSIS — I493 Ventricular premature depolarization: Secondary | ICD-10-CM | POA: Diagnosis not present

## 2017-03-10 DIAGNOSIS — Z4502 Encounter for adjustment and management of automatic implantable cardiac defibrillator: Secondary | ICD-10-CM | POA: Diagnosis not present

## 2017-03-10 DIAGNOSIS — I4581 Long QT syndrome: Secondary | ICD-10-CM | POA: Diagnosis not present

## 2017-03-10 DIAGNOSIS — I495 Sick sinus syndrome: Secondary | ICD-10-CM | POA: Diagnosis not present

## 2017-03-10 DIAGNOSIS — Z45010 Encounter for checking and testing of cardiac pacemaker pulse generator [battery]: Secondary | ICD-10-CM | POA: Diagnosis not present

## 2017-03-10 DIAGNOSIS — I420 Dilated cardiomyopathy: Secondary | ICD-10-CM | POA: Diagnosis not present

## 2017-03-10 DIAGNOSIS — Z95810 Presence of automatic (implantable) cardiac defibrillator: Secondary | ICD-10-CM | POA: Diagnosis not present

## 2017-03-10 DIAGNOSIS — I499 Cardiac arrhythmia, unspecified: Secondary | ICD-10-CM | POA: Diagnosis not present

## 2017-03-10 DIAGNOSIS — I5022 Chronic systolic (congestive) heart failure: Secondary | ICD-10-CM | POA: Diagnosis not present

## 2017-03-10 DIAGNOSIS — I472 Ventricular tachycardia: Secondary | ICD-10-CM | POA: Diagnosis not present

## 2017-04-24 DIAGNOSIS — Z95810 Presence of automatic (implantable) cardiac defibrillator: Secondary | ICD-10-CM | POA: Diagnosis not present

## 2017-04-24 DIAGNOSIS — I472 Ventricular tachycardia: Secondary | ICD-10-CM | POA: Diagnosis not present

## 2017-04-24 DIAGNOSIS — I5022 Chronic systolic (congestive) heart failure: Secondary | ICD-10-CM | POA: Diagnosis not present

## 2017-04-24 DIAGNOSIS — Z4502 Encounter for adjustment and management of automatic implantable cardiac defibrillator: Secondary | ICD-10-CM | POA: Diagnosis not present

## 2017-08-06 DIAGNOSIS — Z4502 Encounter for adjustment and management of automatic implantable cardiac defibrillator: Secondary | ICD-10-CM | POA: Diagnosis not present

## 2017-08-06 DIAGNOSIS — Z95810 Presence of automatic (implantable) cardiac defibrillator: Secondary | ICD-10-CM | POA: Diagnosis not present

## 2017-10-13 DIAGNOSIS — L578 Other skin changes due to chronic exposure to nonionizing radiation: Secondary | ICD-10-CM | POA: Diagnosis not present

## 2017-10-13 DIAGNOSIS — D492 Neoplasm of unspecified behavior of bone, soft tissue, and skin: Secondary | ICD-10-CM | POA: Diagnosis not present

## 2017-10-13 DIAGNOSIS — Z1283 Encounter for screening for malignant neoplasm of skin: Secondary | ICD-10-CM | POA: Diagnosis not present

## 2017-10-20 DIAGNOSIS — I5022 Chronic systolic (congestive) heart failure: Secondary | ICD-10-CM | POA: Diagnosis not present

## 2017-11-12 DIAGNOSIS — Z4502 Encounter for adjustment and management of automatic implantable cardiac defibrillator: Secondary | ICD-10-CM | POA: Diagnosis not present

## 2017-11-12 DIAGNOSIS — Z95810 Presence of automatic (implantable) cardiac defibrillator: Secondary | ICD-10-CM | POA: Diagnosis not present

## 2017-12-05 ENCOUNTER — Other Ambulatory Visit: Payer: Self-pay | Admitting: Certified Nurse Midwife

## 2017-12-05 DIAGNOSIS — N6310 Unspecified lump in the right breast, unspecified quadrant: Secondary | ICD-10-CM | POA: Diagnosis not present

## 2017-12-05 DIAGNOSIS — N6315 Unspecified lump in the right breast, overlapping quadrants: Secondary | ICD-10-CM

## 2017-12-05 DIAGNOSIS — Z1231 Encounter for screening mammogram for malignant neoplasm of breast: Secondary | ICD-10-CM

## 2017-12-10 DIAGNOSIS — L03011 Cellulitis of right finger: Secondary | ICD-10-CM | POA: Diagnosis not present

## 2017-12-11 ENCOUNTER — Other Ambulatory Visit: Payer: Medicare Other

## 2017-12-12 ENCOUNTER — Ambulatory Visit
Admission: RE | Admit: 2017-12-12 | Discharge: 2017-12-12 | Disposition: A | Discharge status: Home / Self Care | Payer: Medicare Other | Source: Ambulatory Visit | Attending: Certified Nurse Midwife | Admitting: Certified Nurse Midwife

## 2017-12-12 DIAGNOSIS — N6489 Other specified disorders of breast: Secondary | ICD-10-CM | POA: Diagnosis not present

## 2017-12-12 DIAGNOSIS — N6310 Unspecified lump in the right breast, unspecified quadrant: Principal | ICD-10-CM

## 2017-12-12 DIAGNOSIS — R921 Mammographic calcification found on diagnostic imaging of breast: Secondary | ICD-10-CM | POA: Diagnosis not present

## 2017-12-12 DIAGNOSIS — N6315 Unspecified lump in the right breast, overlapping quadrants: Secondary | ICD-10-CM

## 2017-12-16 ENCOUNTER — Other Ambulatory Visit: Payer: Self-pay | Admitting: Certified Nurse Midwife

## 2017-12-16 DIAGNOSIS — R928 Other abnormal and inconclusive findings on diagnostic imaging of breast: Secondary | ICD-10-CM

## 2017-12-16 DIAGNOSIS — R921 Mammographic calcification found on diagnostic imaging of breast: Secondary | ICD-10-CM | POA: Diagnosis not present

## 2017-12-22 ENCOUNTER — Ambulatory Visit
Admission: RE | Admit: 2017-12-22 | Discharge: 2017-12-22 | Disposition: A | Discharge status: Home / Self Care | Payer: Medicare Other | Source: Ambulatory Visit | Attending: Certified Nurse Midwife | Admitting: Certified Nurse Midwife

## 2017-12-22 DIAGNOSIS — R928 Other abnormal and inconclusive findings on diagnostic imaging of breast: Secondary | ICD-10-CM | POA: Diagnosis not present

## 2017-12-22 DIAGNOSIS — R921 Mammographic calcification found on diagnostic imaging of breast: Secondary | ICD-10-CM | POA: Diagnosis not present

## 2017-12-22 HISTORY — PX: BREAST BIOPSY: SHX20

## 2017-12-24 LAB — SURGICAL PATHOLOGY

## 2018-01-14 DIAGNOSIS — I5022 Chronic systolic (congestive) heart failure: Secondary | ICD-10-CM | POA: Diagnosis not present

## 2018-01-14 DIAGNOSIS — Z79899 Other long term (current) drug therapy: Secondary | ICD-10-CM | POA: Diagnosis not present

## 2018-01-14 DIAGNOSIS — K219 Gastro-esophageal reflux disease without esophagitis: Secondary | ICD-10-CM | POA: Diagnosis not present

## 2018-01-14 DIAGNOSIS — R0789 Other chest pain: Secondary | ICD-10-CM | POA: Diagnosis not present

## 2018-01-14 DIAGNOSIS — Z95810 Presence of automatic (implantable) cardiac defibrillator: Secondary | ICD-10-CM | POA: Diagnosis not present

## 2018-01-14 DIAGNOSIS — R9431 Abnormal electrocardiogram [ECG] [EKG]: Secondary | ICD-10-CM | POA: Diagnosis not present

## 2018-01-14 DIAGNOSIS — Z5181 Encounter for therapeutic drug level monitoring: Secondary | ICD-10-CM | POA: Diagnosis not present

## 2018-01-14 DIAGNOSIS — R0602 Shortness of breath: Secondary | ICD-10-CM | POA: Diagnosis not present

## 2018-01-14 DIAGNOSIS — R10816 Epigastric abdominal tenderness: Secondary | ICD-10-CM | POA: Diagnosis not present

## 2018-01-15 DIAGNOSIS — R10816 Epigastric abdominal tenderness: Secondary | ICD-10-CM | POA: Diagnosis not present

## 2018-01-15 DIAGNOSIS — R0789 Other chest pain: Secondary | ICD-10-CM | POA: Diagnosis not present

## 2018-01-16 DIAGNOSIS — M546 Pain in thoracic spine: Secondary | ICD-10-CM | POA: Diagnosis not present

## 2018-01-16 DIAGNOSIS — M9902 Segmental and somatic dysfunction of thoracic region: Secondary | ICD-10-CM | POA: Diagnosis not present

## 2018-01-16 DIAGNOSIS — M542 Cervicalgia: Secondary | ICD-10-CM | POA: Diagnosis not present

## 2018-01-16 DIAGNOSIS — M9901 Segmental and somatic dysfunction of cervical region: Secondary | ICD-10-CM | POA: Diagnosis not present

## 2018-01-19 DIAGNOSIS — M542 Cervicalgia: Secondary | ICD-10-CM | POA: Diagnosis not present

## 2018-01-19 DIAGNOSIS — M9902 Segmental and somatic dysfunction of thoracic region: Secondary | ICD-10-CM | POA: Diagnosis not present

## 2018-01-19 DIAGNOSIS — M546 Pain in thoracic spine: Secondary | ICD-10-CM | POA: Diagnosis not present

## 2018-01-19 DIAGNOSIS — M9901 Segmental and somatic dysfunction of cervical region: Secondary | ICD-10-CM | POA: Diagnosis not present

## 2018-01-21 DIAGNOSIS — M542 Cervicalgia: Secondary | ICD-10-CM | POA: Diagnosis not present

## 2018-01-21 DIAGNOSIS — M546 Pain in thoracic spine: Secondary | ICD-10-CM | POA: Diagnosis not present

## 2018-01-21 DIAGNOSIS — M9902 Segmental and somatic dysfunction of thoracic region: Secondary | ICD-10-CM | POA: Diagnosis not present

## 2018-01-21 DIAGNOSIS — M9901 Segmental and somatic dysfunction of cervical region: Secondary | ICD-10-CM | POA: Diagnosis not present

## 2018-01-23 DIAGNOSIS — M9902 Segmental and somatic dysfunction of thoracic region: Secondary | ICD-10-CM | POA: Diagnosis not present

## 2018-01-23 DIAGNOSIS — M546 Pain in thoracic spine: Secondary | ICD-10-CM | POA: Diagnosis not present

## 2018-01-23 DIAGNOSIS — M9901 Segmental and somatic dysfunction of cervical region: Secondary | ICD-10-CM | POA: Diagnosis not present

## 2018-01-23 DIAGNOSIS — M542 Cervicalgia: Secondary | ICD-10-CM | POA: Diagnosis not present

## 2018-01-26 DIAGNOSIS — M542 Cervicalgia: Secondary | ICD-10-CM | POA: Diagnosis not present

## 2018-01-26 DIAGNOSIS — M9902 Segmental and somatic dysfunction of thoracic region: Secondary | ICD-10-CM | POA: Diagnosis not present

## 2018-01-26 DIAGNOSIS — M9901 Segmental and somatic dysfunction of cervical region: Secondary | ICD-10-CM | POA: Diagnosis not present

## 2018-01-26 DIAGNOSIS — M546 Pain in thoracic spine: Secondary | ICD-10-CM | POA: Diagnosis not present

## 2018-01-30 DIAGNOSIS — M546 Pain in thoracic spine: Secondary | ICD-10-CM | POA: Diagnosis not present

## 2018-01-30 DIAGNOSIS — M542 Cervicalgia: Secondary | ICD-10-CM | POA: Diagnosis not present

## 2018-01-30 DIAGNOSIS — M9902 Segmental and somatic dysfunction of thoracic region: Secondary | ICD-10-CM | POA: Diagnosis not present

## 2018-01-30 DIAGNOSIS — M9901 Segmental and somatic dysfunction of cervical region: Secondary | ICD-10-CM | POA: Diagnosis not present

## 2018-02-02 DIAGNOSIS — M9902 Segmental and somatic dysfunction of thoracic region: Secondary | ICD-10-CM | POA: Diagnosis not present

## 2018-02-02 DIAGNOSIS — M542 Cervicalgia: Secondary | ICD-10-CM | POA: Diagnosis not present

## 2018-02-02 DIAGNOSIS — M9901 Segmental and somatic dysfunction of cervical region: Secondary | ICD-10-CM | POA: Diagnosis not present

## 2018-02-02 DIAGNOSIS — M546 Pain in thoracic spine: Secondary | ICD-10-CM | POA: Diagnosis not present

## 2018-02-04 DIAGNOSIS — Z23 Encounter for immunization: Secondary | ICD-10-CM | POA: Diagnosis not present

## 2018-02-18 DIAGNOSIS — M9901 Segmental and somatic dysfunction of cervical region: Secondary | ICD-10-CM | POA: Diagnosis not present

## 2018-02-18 DIAGNOSIS — Z95810 Presence of automatic (implantable) cardiac defibrillator: Secondary | ICD-10-CM | POA: Diagnosis not present

## 2018-02-18 DIAGNOSIS — M9902 Segmental and somatic dysfunction of thoracic region: Secondary | ICD-10-CM | POA: Diagnosis not present

## 2018-02-18 DIAGNOSIS — M542 Cervicalgia: Secondary | ICD-10-CM | POA: Diagnosis not present

## 2018-02-18 DIAGNOSIS — M546 Pain in thoracic spine: Secondary | ICD-10-CM | POA: Diagnosis not present

## 2018-02-18 DIAGNOSIS — Z4502 Encounter for adjustment and management of automatic implantable cardiac defibrillator: Secondary | ICD-10-CM | POA: Diagnosis not present

## 2018-02-23 DIAGNOSIS — M9901 Segmental and somatic dysfunction of cervical region: Secondary | ICD-10-CM | POA: Diagnosis not present

## 2018-02-23 DIAGNOSIS — M542 Cervicalgia: Secondary | ICD-10-CM | POA: Diagnosis not present

## 2018-02-23 DIAGNOSIS — M546 Pain in thoracic spine: Secondary | ICD-10-CM | POA: Diagnosis not present

## 2018-02-23 DIAGNOSIS — M9902 Segmental and somatic dysfunction of thoracic region: Secondary | ICD-10-CM | POA: Diagnosis not present

## 2018-02-27 DIAGNOSIS — M9901 Segmental and somatic dysfunction of cervical region: Secondary | ICD-10-CM | POA: Diagnosis not present

## 2018-02-27 DIAGNOSIS — M542 Cervicalgia: Secondary | ICD-10-CM | POA: Diagnosis not present

## 2018-02-27 DIAGNOSIS — M9902 Segmental and somatic dysfunction of thoracic region: Secondary | ICD-10-CM | POA: Diagnosis not present

## 2018-02-27 DIAGNOSIS — M546 Pain in thoracic spine: Secondary | ICD-10-CM | POA: Diagnosis not present

## 2018-03-02 DIAGNOSIS — M546 Pain in thoracic spine: Secondary | ICD-10-CM | POA: Diagnosis not present

## 2018-03-02 DIAGNOSIS — M9902 Segmental and somatic dysfunction of thoracic region: Secondary | ICD-10-CM | POA: Diagnosis not present

## 2018-03-02 DIAGNOSIS — M9901 Segmental and somatic dysfunction of cervical region: Secondary | ICD-10-CM | POA: Diagnosis not present

## 2018-03-02 DIAGNOSIS — M542 Cervicalgia: Secondary | ICD-10-CM | POA: Diagnosis not present

## 2018-03-09 DIAGNOSIS — M542 Cervicalgia: Secondary | ICD-10-CM | POA: Diagnosis not present

## 2018-03-09 DIAGNOSIS — M9901 Segmental and somatic dysfunction of cervical region: Secondary | ICD-10-CM | POA: Diagnosis not present

## 2018-03-09 DIAGNOSIS — M9902 Segmental and somatic dysfunction of thoracic region: Secondary | ICD-10-CM | POA: Diagnosis not present

## 2018-03-09 DIAGNOSIS — M546 Pain in thoracic spine: Secondary | ICD-10-CM | POA: Diagnosis not present

## 2018-03-13 DIAGNOSIS — M542 Cervicalgia: Secondary | ICD-10-CM | POA: Diagnosis not present

## 2018-03-13 DIAGNOSIS — M9902 Segmental and somatic dysfunction of thoracic region: Secondary | ICD-10-CM | POA: Diagnosis not present

## 2018-03-13 DIAGNOSIS — M546 Pain in thoracic spine: Secondary | ICD-10-CM | POA: Diagnosis not present

## 2018-03-13 DIAGNOSIS — M9901 Segmental and somatic dysfunction of cervical region: Secondary | ICD-10-CM | POA: Diagnosis not present

## 2018-03-16 DIAGNOSIS — M542 Cervicalgia: Secondary | ICD-10-CM | POA: Diagnosis not present

## 2018-03-16 DIAGNOSIS — M9901 Segmental and somatic dysfunction of cervical region: Secondary | ICD-10-CM | POA: Diagnosis not present

## 2018-03-16 DIAGNOSIS — M546 Pain in thoracic spine: Secondary | ICD-10-CM | POA: Diagnosis not present

## 2018-03-16 DIAGNOSIS — M9902 Segmental and somatic dysfunction of thoracic region: Secondary | ICD-10-CM | POA: Diagnosis not present

## 2018-03-23 DIAGNOSIS — M546 Pain in thoracic spine: Secondary | ICD-10-CM | POA: Diagnosis not present

## 2018-03-23 DIAGNOSIS — M9902 Segmental and somatic dysfunction of thoracic region: Secondary | ICD-10-CM | POA: Diagnosis not present

## 2018-03-23 DIAGNOSIS — M542 Cervicalgia: Secondary | ICD-10-CM | POA: Diagnosis not present

## 2018-03-23 DIAGNOSIS — M9901 Segmental and somatic dysfunction of cervical region: Secondary | ICD-10-CM | POA: Diagnosis not present

## 2018-04-13 DIAGNOSIS — M9901 Segmental and somatic dysfunction of cervical region: Secondary | ICD-10-CM | POA: Diagnosis not present

## 2018-04-13 DIAGNOSIS — M546 Pain in thoracic spine: Secondary | ICD-10-CM | POA: Diagnosis not present

## 2018-04-13 DIAGNOSIS — M542 Cervicalgia: Secondary | ICD-10-CM | POA: Diagnosis not present

## 2018-04-13 DIAGNOSIS — M9902 Segmental and somatic dysfunction of thoracic region: Secondary | ICD-10-CM | POA: Diagnosis not present

## 2018-04-27 DIAGNOSIS — M542 Cervicalgia: Secondary | ICD-10-CM | POA: Diagnosis not present

## 2018-04-27 DIAGNOSIS — M9902 Segmental and somatic dysfunction of thoracic region: Secondary | ICD-10-CM | POA: Diagnosis not present

## 2018-04-27 DIAGNOSIS — M546 Pain in thoracic spine: Secondary | ICD-10-CM | POA: Diagnosis not present

## 2018-04-27 DIAGNOSIS — M9901 Segmental and somatic dysfunction of cervical region: Secondary | ICD-10-CM | POA: Diagnosis not present

## 2018-05-11 DIAGNOSIS — M9901 Segmental and somatic dysfunction of cervical region: Secondary | ICD-10-CM | POA: Diagnosis not present

## 2018-05-11 DIAGNOSIS — M546 Pain in thoracic spine: Secondary | ICD-10-CM | POA: Diagnosis not present

## 2018-05-11 DIAGNOSIS — M9902 Segmental and somatic dysfunction of thoracic region: Secondary | ICD-10-CM | POA: Diagnosis not present

## 2018-05-11 DIAGNOSIS — M542 Cervicalgia: Secondary | ICD-10-CM | POA: Diagnosis not present

## 2018-05-18 DIAGNOSIS — M546 Pain in thoracic spine: Secondary | ICD-10-CM | POA: Diagnosis not present

## 2018-05-18 DIAGNOSIS — M542 Cervicalgia: Secondary | ICD-10-CM | POA: Diagnosis not present

## 2018-05-18 DIAGNOSIS — M9902 Segmental and somatic dysfunction of thoracic region: Secondary | ICD-10-CM | POA: Diagnosis not present

## 2018-05-18 DIAGNOSIS — M9901 Segmental and somatic dysfunction of cervical region: Secondary | ICD-10-CM | POA: Diagnosis not present

## 2018-05-25 DIAGNOSIS — M542 Cervicalgia: Secondary | ICD-10-CM | POA: Diagnosis not present

## 2018-05-25 DIAGNOSIS — M546 Pain in thoracic spine: Secondary | ICD-10-CM | POA: Diagnosis not present

## 2018-05-25 DIAGNOSIS — M9902 Segmental and somatic dysfunction of thoracic region: Secondary | ICD-10-CM | POA: Diagnosis not present

## 2018-05-25 DIAGNOSIS — M9901 Segmental and somatic dysfunction of cervical region: Secondary | ICD-10-CM | POA: Diagnosis not present

## 2018-07-01 DIAGNOSIS — I472 Ventricular tachycardia: Secondary | ICD-10-CM | POA: Diagnosis not present

## 2018-10-01 DIAGNOSIS — Z95810 Presence of automatic (implantable) cardiac defibrillator: Secondary | ICD-10-CM | POA: Diagnosis not present

## 2018-10-01 DIAGNOSIS — Z4502 Encounter for adjustment and management of automatic implantable cardiac defibrillator: Secondary | ICD-10-CM | POA: Diagnosis not present

## 2018-10-01 DIAGNOSIS — I472 Ventricular tachycardia: Secondary | ICD-10-CM | POA: Diagnosis not present

## 2018-10-01 DIAGNOSIS — I5022 Chronic systolic (congestive) heart failure: Secondary | ICD-10-CM | POA: Diagnosis not present

## 2018-10-12 DIAGNOSIS — I5022 Chronic systolic (congestive) heart failure: Secondary | ICD-10-CM | POA: Diagnosis not present

## 2018-10-12 DIAGNOSIS — I472 Ventricular tachycardia: Secondary | ICD-10-CM | POA: Diagnosis not present

## 2018-10-19 DIAGNOSIS — D492 Neoplasm of unspecified behavior of bone, soft tissue, and skin: Secondary | ICD-10-CM | POA: Diagnosis not present

## 2018-10-19 DIAGNOSIS — Z808 Family history of malignant neoplasm of other organs or systems: Secondary | ICD-10-CM | POA: Diagnosis not present

## 2018-10-19 DIAGNOSIS — Z1283 Encounter for screening for malignant neoplasm of skin: Secondary | ICD-10-CM | POA: Diagnosis not present

## 2018-10-19 DIAGNOSIS — L84 Corns and callosities: Secondary | ICD-10-CM | POA: Diagnosis not present

## 2018-10-19 DIAGNOSIS — L308 Other specified dermatitis: Secondary | ICD-10-CM | POA: Diagnosis not present

## 2018-10-19 DIAGNOSIS — L2089 Other atopic dermatitis: Secondary | ICD-10-CM | POA: Diagnosis not present

## 2018-10-19 DIAGNOSIS — L578 Other skin changes due to chronic exposure to nonionizing radiation: Secondary | ICD-10-CM | POA: Diagnosis not present

## 2018-12-03 DIAGNOSIS — Z23 Encounter for immunization: Secondary | ICD-10-CM | POA: Diagnosis not present

## 2018-12-19 DIAGNOSIS — Z95810 Presence of automatic (implantable) cardiac defibrillator: Secondary | ICD-10-CM | POA: Diagnosis not present

## 2018-12-19 DIAGNOSIS — I517 Cardiomegaly: Secondary | ICD-10-CM | POA: Diagnosis not present

## 2018-12-19 DIAGNOSIS — Z20828 Contact with and (suspected) exposure to other viral communicable diseases: Secondary | ICD-10-CM | POA: Diagnosis not present

## 2018-12-19 DIAGNOSIS — R9431 Abnormal electrocardiogram [ECG] [EKG]: Secondary | ICD-10-CM | POA: Diagnosis not present

## 2018-12-19 DIAGNOSIS — R509 Fever, unspecified: Secondary | ICD-10-CM | POA: Diagnosis not present

## 2018-12-19 DIAGNOSIS — I493 Ventricular premature depolarization: Secondary | ICD-10-CM | POA: Diagnosis not present

## 2018-12-19 DIAGNOSIS — R000 Tachycardia, unspecified: Secondary | ICD-10-CM | POA: Diagnosis not present

## 2018-12-21 DIAGNOSIS — Z20828 Contact with and (suspected) exposure to other viral communicable diseases: Secondary | ICD-10-CM | POA: Diagnosis not present

## 2018-12-28 DIAGNOSIS — I5022 Chronic systolic (congestive) heart failure: Secondary | ICD-10-CM | POA: Diagnosis not present

## 2018-12-31 DIAGNOSIS — Z95810 Presence of automatic (implantable) cardiac defibrillator: Secondary | ICD-10-CM | POA: Diagnosis not present

## 2018-12-31 DIAGNOSIS — Z4502 Encounter for adjustment and management of automatic implantable cardiac defibrillator: Secondary | ICD-10-CM | POA: Diagnosis not present

## 2019-01-07 DIAGNOSIS — R5383 Other fatigue: Secondary | ICD-10-CM | POA: Diagnosis not present

## 2019-01-07 DIAGNOSIS — Z5181 Encounter for therapeutic drug level monitoring: Secondary | ICD-10-CM | POA: Diagnosis not present

## 2019-01-07 DIAGNOSIS — Z4502 Encounter for adjustment and management of automatic implantable cardiac defibrillator: Secondary | ICD-10-CM | POA: Diagnosis not present

## 2019-01-07 DIAGNOSIS — I440 Atrioventricular block, first degree: Secondary | ICD-10-CM | POA: Diagnosis not present

## 2019-01-07 DIAGNOSIS — I472 Ventricular tachycardia: Secondary | ICD-10-CM | POA: Diagnosis not present

## 2019-01-07 DIAGNOSIS — I428 Other cardiomyopathies: Secondary | ICD-10-CM | POA: Diagnosis not present

## 2019-01-07 DIAGNOSIS — Z95810 Presence of automatic (implantable) cardiac defibrillator: Secondary | ICD-10-CM | POA: Diagnosis not present

## 2019-01-07 DIAGNOSIS — I5022 Chronic systolic (congestive) heart failure: Secondary | ICD-10-CM | POA: Diagnosis not present

## 2019-01-07 DIAGNOSIS — Z79899 Other long term (current) drug therapy: Secondary | ICD-10-CM | POA: Diagnosis not present

## 2019-01-18 DIAGNOSIS — Z03818 Encounter for observation for suspected exposure to other biological agents ruled out: Secondary | ICD-10-CM | POA: Diagnosis not present

## 2020-06-13 IMAGING — MG MM BREAST BX W LOC DEV 1ST LESION IMAGE BX SPEC STEREO GUIDE*L*
8 of 12 series · 8 of 24 positions shown · non-contrast
Comparison: Previous exams.

ADDENDUM:
Pathology of the left breast biopsy revealed A. BREAST
CALCIFICATIONS, LEFT; STEREOTACTIC BIOPSY: CYSTIC APOCRINE
METAPLASIA WITH POLARIZABLE CALCIUM OXALATE. DENSE FIBROUS STROMA
WITH DUCT ECTASIA. CALCIFICATIONS ASSOCIATED WITH ATROPHIC MAMMARY
GLANDS. NUMEROUS DEEPER SECTIONS WERE EXAMINED. NEGATIVE FOR ATYPIA
AND MALIGNANCY. Comment: Polarizable calcium oxalate is present in
the tissue submitted in section D. Calcifications are associated
with atrophic mammary glands in the tissue submitted in section E.

This was found to be concordant with Dr. Shalley impression and
notes.
Recommendation: Six month follow-up diagnostic mammogram left
breast.
At the patient's request, results and recommendations were relayed
to the patient by phone by Lugo, Owais on 12/24/17. The patient
stated she did well following the biopsy with no bleeding, bruising
or pain. Post biopsy instructions were reviewed with the patient and
all of her questions were answered. She was encouraged to contact
the [HOSPITAL] with any further questions or concerns.
Addendum by Lugo, Owais on 12/24/17.
CLINICAL DATA: 37-year-old female with indeterminate left breast
calcifications.
EXAM:
LEFT BREAST STEREOTACTIC CORE NEEDLE BIOPSY

[L (1 of 6)]
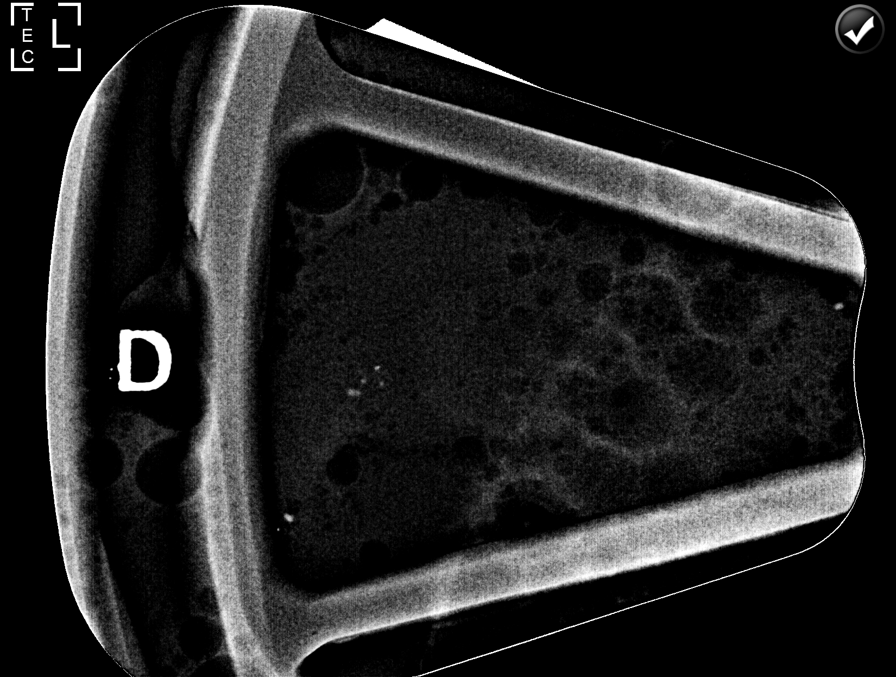

[L (2 of 6)]
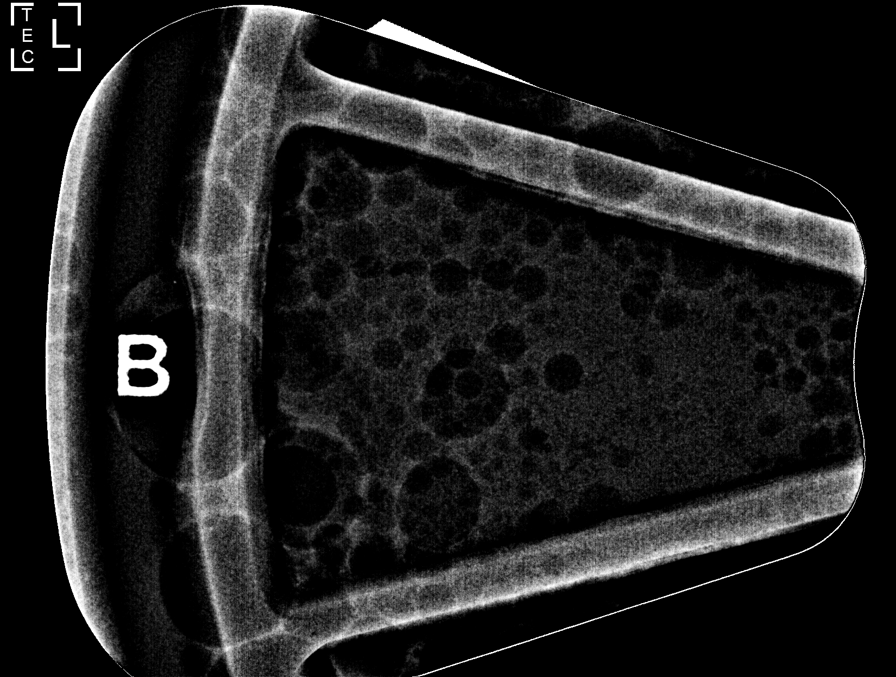

[L (3 of 6)]
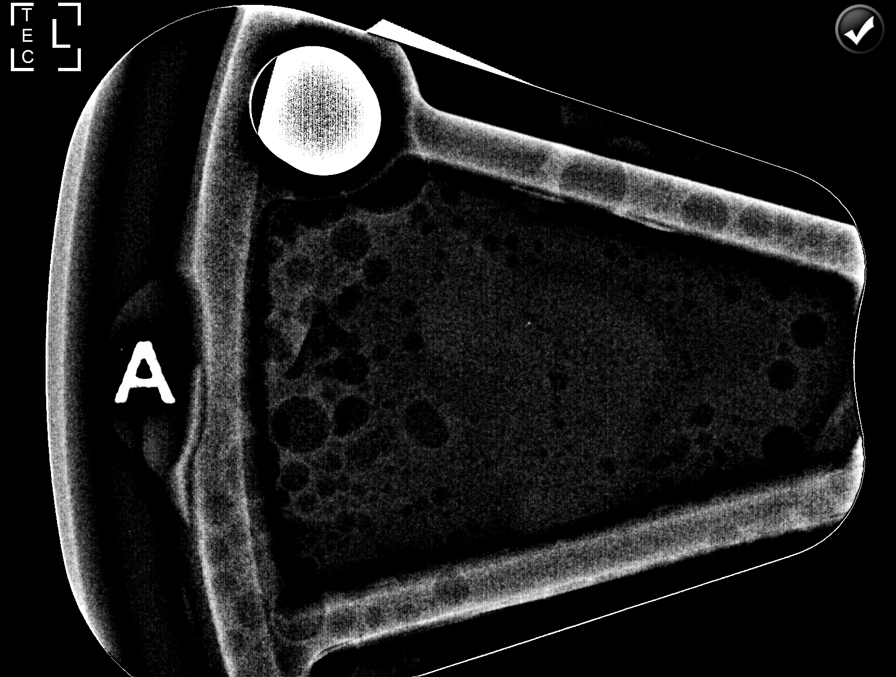

[L (4 of 6)]
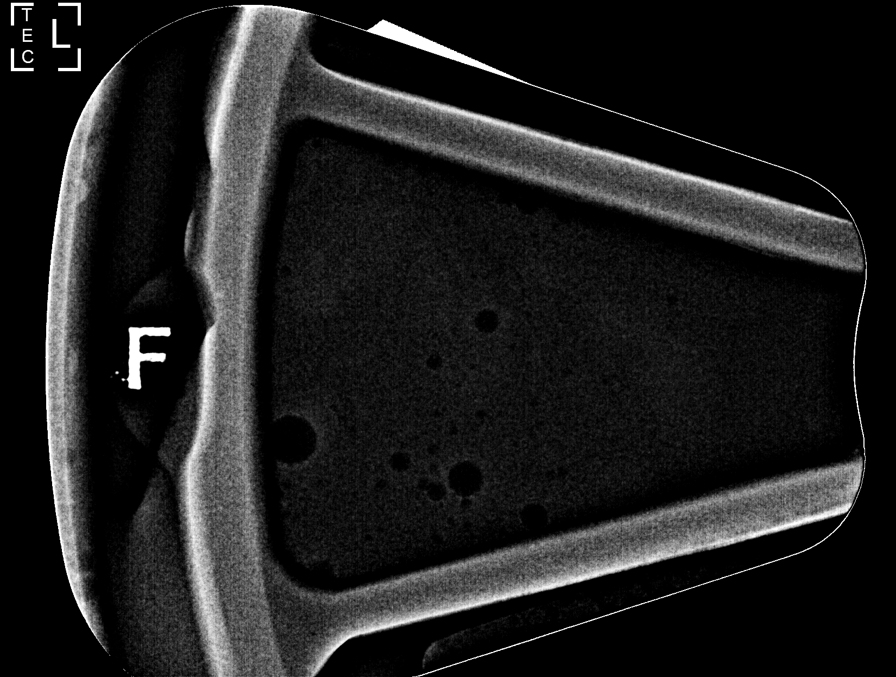

[L (5 of 6)]
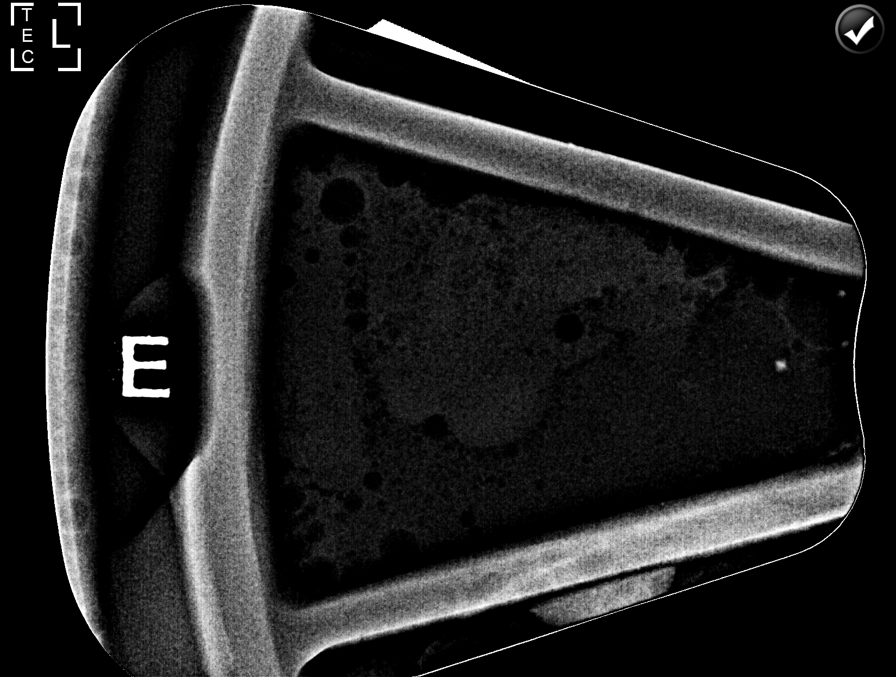

[L (6 of 6)]
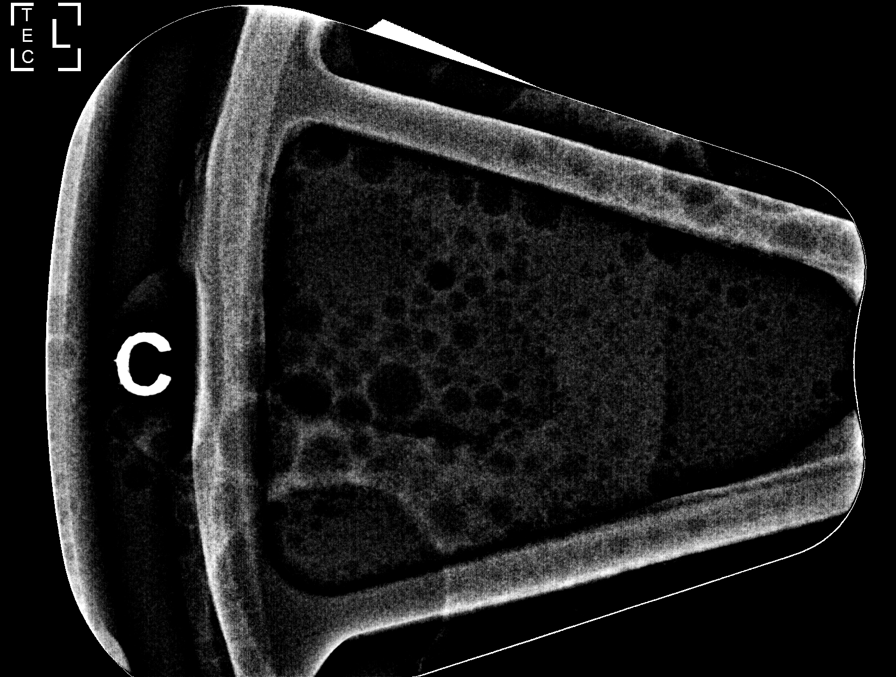

[L LM synth-2D]
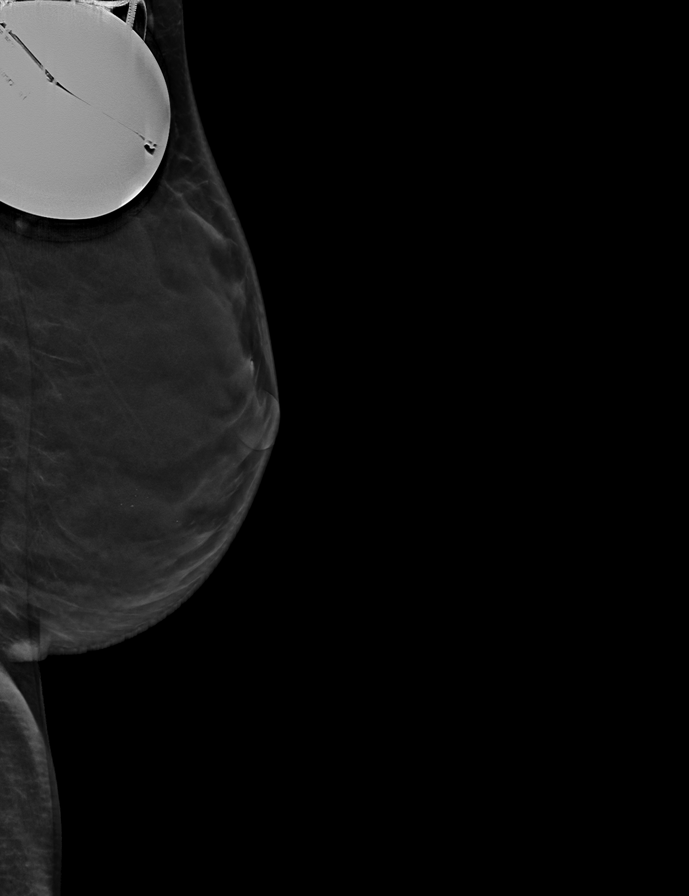

[L CC synth-2D]
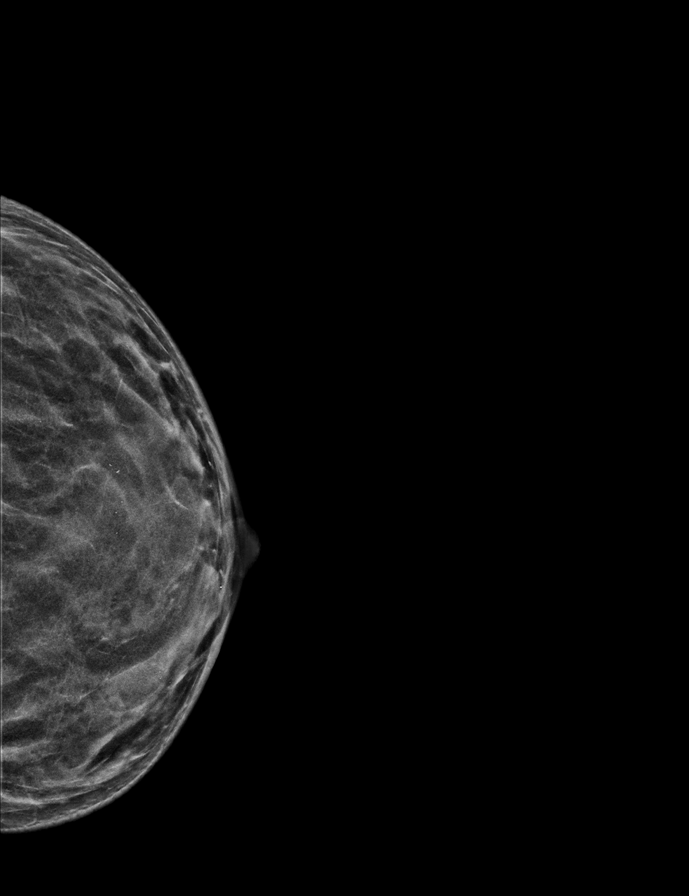

[8 of 24 positions shown; findings below may reference images not displayed]



Using sterile technique and 1% Lidocaine as local anesthetic, under
stereotactic guidance, a 9 gauge vacuum assisted device was used to
perform core needle biopsy of calcifications in the lower outer
quadrant of the left breast using a lateral approach. Specimen
radiograph was performed showing calcifications in several
specimens. Specimens with calcifications are identified for
pathology.

Lesion quadrant: Lower outer quadrant

At the conclusion of the procedure, a X shaped tissue marker clip
was deployed into the biopsy cavity. Follow-up 2-view mammogram was
performed and dictated separately.
IMPRESSION: Stereotactic-guided biopsy of left breast calcifications. No
apparent complications.
